# Patient Record
Sex: Male | Born: 1995 | Race: Black or African American | Hispanic: No | Marital: Single | Smoking: Never smoker
Health system: Southern US, Community
[De-identification: ages and names within clinical notes are randomized; demographics above are authoritative.]

## PROBLEM LIST (undated history)

## (undated) DIAGNOSIS — J45909 Unspecified asthma, uncomplicated: Secondary | ICD-10-CM

---

## 2015-03-09 ENCOUNTER — Emergency Department (HOSPITAL_COMMUNITY)
Admission: EM | Admit: 2015-03-09 | Discharge: 2015-03-09 | Disposition: A | Discharge status: Home / Self Care | Payer: BLUE CROSS/BLUE SHIELD | Attending: Emergency Medicine | Admitting: Emergency Medicine

## 2015-03-09 ENCOUNTER — Encounter (HOSPITAL_COMMUNITY): Payer: Self-pay | Admitting: *Deleted

## 2015-03-09 DIAGNOSIS — Y998 Other external cause status: Secondary | ICD-10-CM | POA: Diagnosis not present

## 2015-03-09 DIAGNOSIS — J45909 Unspecified asthma, uncomplicated: Secondary | ICD-10-CM | POA: Diagnosis not present

## 2015-03-09 DIAGNOSIS — Y92410 Unspecified street and highway as the place of occurrence of the external cause: Secondary | ICD-10-CM | POA: Diagnosis not present

## 2015-03-09 DIAGNOSIS — S299XXA Unspecified injury of thorax, initial encounter: Secondary | ICD-10-CM | POA: Insufficient documentation

## 2015-03-09 DIAGNOSIS — M546 Pain in thoracic spine: Secondary | ICD-10-CM

## 2015-03-09 DIAGNOSIS — Y9389 Activity, other specified: Secondary | ICD-10-CM | POA: Diagnosis not present

## 2015-03-09 HISTORY — DX: Unspecified asthma, uncomplicated: J45.909

## 2015-03-09 MED ORDER — METHOCARBAMOL 500 MG PO TABS
1000.0000 mg | ORAL_TABLET | Freq: Once | ORAL | Status: DC
  Filled 2015-03-09: qty 2

## 2015-03-09 MED ORDER — IBUPROFEN 800 MG PO TABS: 800.0000 mg | ORAL_TABLET | Freq: Three times a day (TID) | ORAL | Status: DC

## 2015-03-09 MED ORDER — METHOCARBAMOL 500 MG PO TABS: 500.0000 mg | ORAL_TABLET | Freq: Two times a day (BID) | ORAL | Status: DC

## 2015-03-09 MED ORDER — IBUPROFEN 400 MG PO TABS
800.0000 mg | ORAL_TABLET | Freq: Once | ORAL | Status: AC
  Administered 2015-03-09: 800 mg via ORAL
  Filled 2015-03-09: qty 2
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
## 2016-01-27 ENCOUNTER — Encounter (HOSPITAL_COMMUNITY): Payer: Self-pay | Admitting: Family Medicine

## 2016-01-27 ENCOUNTER — Ambulatory Visit (HOSPITAL_COMMUNITY)
Admission: EM | Admit: 2016-01-27 | Discharge: 2016-01-27 | Disposition: A | Discharge status: Home / Self Care | Payer: Medicaid Other | Attending: Family Medicine | Admitting: Family Medicine

## 2016-01-27 DIAGNOSIS — J0100 Acute maxillary sinusitis, unspecified: Secondary | ICD-10-CM

## 2016-01-27 DIAGNOSIS — J302 Other seasonal allergic rhinitis: Secondary | ICD-10-CM | POA: Diagnosis not present

## 2016-01-27 MED ORDER — FLUTICASONE PROPIONATE 50 MCG/ACT NA SUSP: 2.0000 | Freq: Every day | NASAL | 12 refills | Status: AC

## 2016-01-27 MED ORDER — AMOXICILLIN-POT CLAVULANATE 875-125 MG PO TABS: 1.0000 | ORAL_TABLET | Freq: Two times a day (BID) | ORAL | 0 refills | Status: DC

## 2016-01-27 NOTE — ED Triage Notes (Signed)
Pt here for nasal drainage with mucous that has been going on for over a month. sts all started after wisdom tooth surgery. sts he has been treated with abx x 1 and got a little better.

## 2016-01-27 NOTE — ED Provider Notes (Signed)
[facility]    CSN: [id number] Arrival date & time: 01/27/16  1614  First Provider Contact:  First MD Initiated Contact with Patient 01/27/16 1637        History   Chief Complaint Chief Complaint  Patient presents with  . Facial Pain  . Nasal Congestion    HPI Larry Shea is a 20 y.o. male.   This is 20 year old gentleman with 1 month of persistent sinus congestion and postnasal drainage. He works as a Scientist, forensiccertified medical assistant.  Patient had some dental work done which was complicated by an impacted wisdom tooth. He was given an antibiotic, with some improvement.  In the past patient has had chronic sinus problems that were alleviated by nasal sprays. He hasn't had the nasal spray in over a year.  Patient denies fever, cough, shortness of breath, or epistaxis      Past Medical History:  Diagnosis Date  . Asthma     There are no active problems to display for this patient.   History reviewed. No pertinent surgical history.     Home Medications    Prior to Admission medications   Medication Sig Start Date End Date Taking? Authorizing Provider  amoxicillin-clavulanate (AUGMENTIN) 875-125 MG tablet Take 1 tablet by mouth 2 (two) times daily. Take with food 01/27/16   Elvina SidleKurt Tavin Vernet, MD  fluticasone [facility](FLONASE) 50 MCG/ACT nasal spray Place 2 sprays into both nostrils daily. 01/27/16   Elvina SidleKurt Ogle Hoeffner, MD    Family History History reviewed. No pertinent family history.  Social History Social History  Substance Use Topics  . Smoking status: Never Smoker  . Smokeless tobacco: Never Used  . Alcohol use No     Allergies   Review of patient's allergies indicates no known allergies.   Review of Systems Review of Systems  Constitutional: Negative.   HENT: Positive for congestion, dental problem and postnasal drip. Negative for ear discharge, ear pain, facial swelling, mouth sores and sore throat.   Eyes: Negative.   Respiratory: Negative.     Cardiovascular: Negative.      Physical Exam Triage Vital Signs ED Triage Vitals [01/27/16 1632]  Enc Vitals Group     BP 115/76     Pulse Rate 85     Resp 18     Temp 98 F (36.7 C)     Temp src      SpO2 98 %     Weight      Height      Head Circumference      Peak Flow      Pain Score      Pain Loc      Pain Edu?      Excl. in GC?    No data found.   Updated Vital Signs BP 115/76   Pulse 85   Temp 98 F (36.7 C)   Resp 18   SpO2 98%   Visual Acuity Right Eye Distance:   Left Eye Distance:   Bilateral Distance:    Right Eye Near:   Left Eye Near:    Bilateral Near:     Physical Exam  Constitutional: He is oriented to person, place, and time. He appears well-developed and well-nourished.  HENT:  Head: Normocephalic and atraumatic.  Right Ear: External ear normal.  Left Ear: External ear normal.  Mouth/Throat: Oropharynx is clear and moist.  Bilateral nasal passage swelling with exudates  Eyes: Conjunctivae are normal. Pupils are equal, round, and reactive to light.  Neck:  Normal range of motion. Neck supple.  Cardiovascular: Normal rate, regular rhythm, normal heart sounds and intact distal pulses.   Pulmonary/Chest: Effort normal and breath sounds normal.  Musculoskeletal: Normal range of motion.  Neurological: He is alert and oriented to person, place, and time.  Nursing note and vitals reviewed.    UC Treatments / Results  Labs (all labs ordered are listed, but only abnormal results are displayed) Labs Reviewed - No data to display  EKG  EKG Interpretation None       Radiology No results found.  Procedures Procedures (including critical care time)  Medications Ordered in UC Medications - No data to display   Initial Impression / Assessment and Plan / UC Course  I have reviewed the triage vital signs and the nursing notes.  Pertinent labs & imaging results that were available during my care of the patient were reviewed by me  and considered in my medical decision making (see chart for details).  Clinical Course      Final Clinical Impressions(s) / UC Diagnoses   Final diagnoses:  Acute maxillary sinusitis, recurrence not specified  Seasonal allergies    New Prescriptions New Prescriptions   AMOXICILLIN-CLAVULANATE (AUGMENTIN) 875-125 MG TABLET    Take 1 tablet by mouth 2 (two) times daily. Take with food   FLUTICASONE (FLONASE) 50 MCG/ACT NASAL SPRAY    Place 2 sprays into both nostrils daily.     Elvina Sidle, MD 01/27/16 [phone]

## 2016-03-15 ENCOUNTER — Encounter (HOSPITAL_COMMUNITY): Payer: Self-pay | Admitting: Emergency Medicine

## 2016-03-15 ENCOUNTER — Ambulatory Visit (HOSPITAL_COMMUNITY)
Admission: EM | Admit: 2016-03-15 | Discharge: 2016-03-15 | Disposition: A | Discharge status: Home / Self Care | Payer: Medicaid Other | Attending: Internal Medicine | Admitting: Internal Medicine

## 2016-03-15 DIAGNOSIS — J019 Acute sinusitis, unspecified: Secondary | ICD-10-CM | POA: Diagnosis not present

## 2016-03-15 MED ORDER — PREDNISONE 50 MG PO TABS: 50.0000 mg | ORAL_TABLET | Freq: Every day | ORAL | 0 refills | Status: DC

## 2016-03-15 MED ORDER — CEFDINIR 300 MG PO CAPS: 300.0000 mg | ORAL_CAPSULE | Freq: Two times a day (BID) | ORAL | 0 refills | Status: DC

## 2016-03-15 NOTE — ED Provider Notes (Signed)
[facility]    CSN: [id number] Arrival date & time: 03/15/16  1633     History   Chief Complaint Chief Complaint  Patient presents with  . URI    HPI Larry Shea is a 20 y.o. male. Presents today with several weeks history significant sinus congestion and postnasal drainage, treated 2 with Augmentin. Using Flonase regularly. Onset following wisdom tooth extraction. No fever, no sore throat. Not coughing. Little bit of nausea, no vomiting, no diarrhea. Long history of otitis media as a kid. Has a history of asthma.   HPI  Past Medical History:  Diagnosis Date  . Asthma    History reviewed. No pertinent surgical history.     Home Medications    Prior to Admission medications   Medication Sig Start Date End Date Taking? Authorizing Provider  fluticasone (FLONASE) 50 MCG/ACT nasal spray Place 2 sprays into both nostrils daily. 01/27/16  Yes Elvina Sidle, MD  cefdinir (OMNICEF) 300 MG capsule Take 1 capsule (300 mg total) by mouth 2 (two) times daily. 03/15/16   Eustace Moore, MD  predniSONE (DELTASONE) 50 MG tablet Take 1 tablet (50 mg total) by mouth daily. 03/15/16   Eustace Moore, MD    Family History History reviewed. No pertinent family history.  Social History Social History  Substance Use Topics  . Smoking status: Never Smoker  . Smokeless tobacco: Never Used  . Alcohol use No     Allergies   Review of patient's allergies indicates no known allergies.   Review of Systems Review of Systems  All other systems reviewed and are negative.    Physical Exam Triage Vital Signs ED Triage Vitals [03/15/16 1658]  Enc Vitals Group     BP 130/70     Pulse Rate 100     Resp 18     Temp 98.8 F (37.1 C)     Temp Source Oral     SpO2 99 %     Weight      Height    Updated Vital Signs BP 130/70 (BP Location: Left Arm)   Pulse 100   Temp 98.8 F (37.1 C) (Oral)   Resp 18   SpO2 99%   Physical Exam  Constitutional: He is oriented  to person, place, and time. No distress.  Alert, nicely groomed Voice sounds very congested  HENT:  Head: Atraumatic.  Bilateral TMs are very dull, left is red Severe nasal congestion bilaterally Throat is quite red with copious postnasal drainage   Eyes:  Conjugate gaze, no eye redness/drainage  Neck: Neck supple.  Cardiovascular: Normal rate and regular rhythm.   Pulmonary/Chest: No respiratory distress.  Lungs clear, symmetric breath sounds  Abdominal: He exhibits no distension.  Musculoskeletal: Normal range of motion.  Neurological: He is alert and oriented to person, place, and time.  Skin: Skin is warm and dry.  No cyanosis  Nursing note and vitals reviewed.    UC Treatments / Results   Procedures Procedures (including critical care time)      None today   Final Clinical Impressions(s) / UC Diagnoses   Final diagnoses:  Acute sinusitis with symptoms > 10 days   Recheck for new fever >100.5, increasing phlegm production/nasal discharge, or if not starting to improve in a few days.  Congestion will take time to resolve, sometimes several weeks.  Might benefit from ENT evaluation if continuing to have frequent episodes of sinusitis.  New Prescriptions New Prescriptions   CEFDINIR (OMNICEF) 300 MG  CAPSULE    Take 1 capsule (300 mg total) by mouth 2 (two) times daily.   PREDNISONE (DELTASONE) 50 MG TABLET    Take 1 tablet (50 mg total) by mouth daily.     Eustace MooreLaura W Nguyet Mercer, MD 03/17/16 [phone]

## 2016-03-15 NOTE — ED Triage Notes (Signed)
Pt c/o cold sx onset 1 week associated w/HA, runny nose, nasal congestion  Denies fevers, chills  Taking Allegra and Flonase w/temp relief.   A&O x4... NAD

## 2016-03-15 NOTE — Discharge Instructions (Addendum)
Recheck for new fever >100.5, increasing phlegm production/nasal discharge, or if not starting to improve in a few days.  Congestion will take time to resolve, sometimes several weeks.  Might benefit from ENT evaluation if continuing to have frequent episodes of sinusitis.

## 2016-04-03 ENCOUNTER — Encounter (HOSPITAL_COMMUNITY): Payer: Self-pay | Admitting: Emergency Medicine

## 2016-04-03 ENCOUNTER — Ambulatory Visit (HOSPITAL_COMMUNITY)
Admission: EM | Admit: 2016-04-03 | Discharge: 2016-04-03 | Disposition: A | Discharge status: Home / Self Care | Payer: Medicaid Other | Attending: Family Medicine | Admitting: Family Medicine

## 2016-04-03 ENCOUNTER — Ambulatory Visit (INDEPENDENT_AMBULATORY_CARE_PROVIDER_SITE_OTHER): Payer: Medicaid Other

## 2016-04-03 DIAGNOSIS — J4521 Mild intermittent asthma with (acute) exacerbation: Secondary | ICD-10-CM

## 2016-04-03 MED ORDER — ALBUTEROL SULFATE (2.5 MG/3ML) 0.083% IN NEBU
5.0000 mg | INHALATION_SOLUTION | Freq: Once | RESPIRATORY_TRACT | Status: AC
  Administered 2016-04-03: 5 mg via RESPIRATORY_TRACT

## 2016-04-03 MED ORDER — IPRATROPIUM BROMIDE 0.02 % IN SOLN
0.5000 mg | Freq: Once | RESPIRATORY_TRACT | Status: AC
  Administered 2016-04-03: 0.5 mg via RESPIRATORY_TRACT

## 2016-04-03 MED ORDER — ALBUTEROL SULFATE (2.5 MG/3ML) 0.083% IN NEBU
INHALATION_SOLUTION | RESPIRATORY_TRACT | Status: AC
  Filled 2016-04-03: qty 6

## 2016-04-03 MED ORDER — METHYLPREDNISOLONE ACETATE 80 MG/ML IJ SUSP
INTRAMUSCULAR | Status: AC
  Filled 2016-04-03: qty 1

## 2016-04-03 MED ORDER — IPRATROPIUM BROMIDE 0.02 % IN SOLN
RESPIRATORY_TRACT | Status: AC
  Filled 2016-04-03: qty 2.5

## 2016-04-03 MED ORDER — ALBUTEROL SULFATE HFA 108 (90 BASE) MCG/ACT IN AERS: 2.0000 | INHALATION_SPRAY | Freq: Four times a day (QID) | RESPIRATORY_TRACT | 1 refills | Status: AC | PRN

## 2016-04-03 MED ORDER — METHYLPREDNISOLONE ACETATE 40 MG/ML IJ SUSP
80.0000 mg | Freq: Once | INTRAMUSCULAR | Status: AC
  Administered 2016-04-03: 80 mg via INTRAMUSCULAR

## 2016-04-03 NOTE — Discharge Instructions (Signed)
Use inhaler as needed, see your doctor if further problems.

## 2016-04-03 NOTE — ED Triage Notes (Signed)
[facility]. The patient reported that he has been having increased SOB that gets worse at night for 2 months. The patient stated that he believed that it could be exacerbation of his asthma.

## 2016-04-03 NOTE — ED Provider Notes (Signed)
[facility]    CSN: [id number] Arrival date & time: 04/03/16  1038     History   Chief Complaint Chief Complaint  Patient presents with  . Shortness of Breath    HPI Rise MuDrequan Valiente is a 20 y.o. male.   The history is provided by the patient.  Shortness of Breath  Severity:  Mild Onset quality:  Gradual Duration:  2 months Progression:  Worsening Chronicity:  New Context: pollens and weather changes   Relieved by:  None tried Worsened by:  Activity and movement Associated symptoms: sputum production and wheezing   Associated symptoms: no fever     Past Medical History:  Diagnosis Date  . Asthma     There are no active problems to display for this patient.   History reviewed. No pertinent surgical history.     Home Medications    Prior to Admission medications   Medication Sig Start Date End Date Taking? Authorizing Provider  cetirizine (ZYRTEC) 10 MG tablet Take 10 mg by mouth daily.   Yes Historical Provider, MD  cefdinir (OMNICEF) 300 MG capsule Take 1 capsule (300 mg total) by mouth 2 (two) times daily. 03/15/16   Eustace MooreLaura W Murray, MD  fluticasone (FLONASE) 50 MCG/ACT nasal spray Place 2 sprays into both nostrils daily. 01/27/16   Elvina SidleKurt Lauenstein, MD  predniSONE (DELTASONE) 50 MG tablet Take 1 tablet (50 mg total) by mouth daily. 03/15/16   Eustace MooreLaura W Murray, MD    Family History History reviewed. No pertinent family history.  Social History Social History  Substance Use Topics  . Smoking status: Never Smoker  . Smokeless tobacco: Never Used  . Alcohol use No     Allergies   Patient has no known allergies.   Review of Systems Review of Systems  Constitutional: Positive for activity change. Negative for fever.  HENT: Positive for congestion, postnasal drip and rhinorrhea.   Respiratory: Positive for sputum production, shortness of breath and wheezing.   Cardiovascular: Negative.   Gastrointestinal: Negative.      Physical  Exam Triage Vital Signs ED Triage Vitals  Enc Vitals Group     BP 04/03/16 1123 108/73     Pulse Rate 04/03/16 1123 85     Resp 04/03/16 1123 16     Temp 04/03/16 1123 97.9 F (36.6 C)     Temp Source 04/03/16 1123 Oral     SpO2 04/03/16 1123 98 %     Weight --      Height --      Head Circumference --      Peak Flow --      Pain Score 04/03/16 1131 0     Pain Loc --      Pain Edu? --      Excl. in GC? --    No data found.   Updated Vital Signs BP 108/73 (BP Location: Left Arm)   Pulse 85   Temp 97.9 F (36.6 C) (Oral)   Resp 16   SpO2 98%   Visual Acuity Right Eye Distance:   Left Eye Distance:   Bilateral Distance:    Right Eye Near:   Left Eye Near:    Bilateral Near:     Physical Exam  Constitutional: He appears well-developed and well-nourished. No distress.  HENT:  Right Ear: External ear normal.  Left Ear: External ear normal.  Mouth/Throat: Oropharynx is clear and moist.  Eyes: Pupils are equal, round, and reactive to light.  Neck: Normal range  of motion. Neck supple.  Cardiovascular: Normal rate, regular rhythm and normal heart sounds.   Pulmonary/Chest: Effort normal and breath sounds normal. He has no wheezes.  Lymphadenopathy:    He has no cervical adenopathy.  Neurological: He is alert.  Skin: Skin is warm and dry.  Nursing note and vitals reviewed.    UC Treatments / Results  Labs (all labs ordered are listed, but only abnormal results are displayed) Labs Reviewed - No data to display  EKG  EKG Interpretation None       Radiology No results found. X-rays reviewed and report per radiologist.  Procedures Procedures (including critical care time)  Medications Ordered in UC Medications - No data to display   Initial Impression / Assessment and Plan / UC Course  I have reviewed the triage vital signs and the nursing notes.  Pertinent labs & imaging results that were available during my care of the patient were reviewed by me  and considered in my medical decision making (see chart for details).  Clinical Course       Final Clinical Impressions(s) / UC Diagnoses   Final diagnoses:  None    New Prescriptions New Prescriptions   No medications on file     Linna HoffJames D Adewale Pucillo, MD 04/03/16 1223

## 2016-04-23 ENCOUNTER — Emergency Department (HOSPITAL_COMMUNITY): Payer: Medicaid Other

## 2016-04-23 ENCOUNTER — Emergency Department (HOSPITAL_COMMUNITY)
Admission: EM | Admit: 2016-04-23 | Discharge: 2016-04-23 | Disposition: A | Discharge status: Home / Self Care | Payer: Medicaid Other | Attending: Emergency Medicine | Admitting: Emergency Medicine

## 2016-04-23 ENCOUNTER — Encounter (HOSPITAL_COMMUNITY): Payer: Self-pay | Admitting: Emergency Medicine

## 2016-04-23 DIAGNOSIS — J45909 Unspecified asthma, uncomplicated: Secondary | ICD-10-CM | POA: Insufficient documentation

## 2016-04-23 DIAGNOSIS — R05 Cough: Secondary | ICD-10-CM | POA: Insufficient documentation

## 2016-04-23 DIAGNOSIS — R059 Cough, unspecified: Secondary | ICD-10-CM

## 2016-04-23 MED ORDER — PREDNISONE 20 MG PO TABS
60.0000 mg | ORAL_TABLET | Freq: Once | ORAL | Status: AC
  Administered 2016-04-23: 60 mg via ORAL
  Filled 2016-04-23: qty 3

## 2016-04-23 MED ORDER — PREDNISONE 50 MG PO TABS: 50.0000 mg | ORAL_TABLET | Freq: Every day | ORAL | 0 refills | Status: DC

## 2016-04-23 MED ORDER — BUDESONIDE 90 MCG/ACT IN AEPB: 1.0000 | INHALATION_SPRAY | Freq: Two times a day (BID) | RESPIRATORY_TRACT | 0 refills | Status: AC

## 2016-04-23 MED ORDER — IPRATROPIUM-ALBUTEROL 0.5-2.5 (3) MG/3ML IN SOLN
3.0000 mL | Freq: Once | RESPIRATORY_TRACT | Status: AC
  Administered 2016-04-23: 3 mL via RESPIRATORY_TRACT
  Filled 2016-04-23: qty 3

## 2016-04-23 NOTE — ED Notes (Signed)
Pt stable, ambulatory, states understanding of discharge instructions 

## 2016-04-23 NOTE — ED Triage Notes (Signed)
Pt. Stated, I was given an inhaler which I've been using, last used this morning.

## 2016-04-23 NOTE — ED Provider Notes (Signed)
[facility] Note   CSN: [id number] Arrival date & time: 04/23/16  1842     History   Chief Complaint Chief Complaint  Patient presents with  . Nasal Congestion  . Asthma    HPI Larry Shea is a 20 y.o. male.  He has had ongoing problems with cough for the last 3 months. This has been associated with a pressure feeling in his chest. He has seen his primary care provider and has been given 2 courses of antibiotics without any improvement. He was put on a course of prednisone which did give some temporary improvement. He went to urgent care about 2 weeks ago was given an inhaler which does give slight, temporary relief. He denies fever, chills, sweats. He denies dyspnea. Cough is productive of yellowish to greenish sputum. He is a nonsmoker.   The history is provided by the patient.  Asthma     Past Medical History:  Diagnosis Date  . Asthma     There are no active problems to display for this patient.   History reviewed. No pertinent surgical history.     Home Medications    Prior to Admission medications   Medication Sig Start Date End Date Taking? Authorizing Provider  albuterol (PROVENTIL HFA;VENTOLIN HFA) 108 (90 Base) MCG/ACT inhaler Inhale 2 puffs into the lungs every 6 (six) hours as needed for wheezing or shortness of breath. 04/03/16   Linna HoffJames D Kindl, MD  cefdinir (OMNICEF) 300 MG capsule Take 1 capsule (300 mg total) by mouth 2 (two) times daily. 03/15/16   Eustace MooreLaura W Murray, MD  cetirizine (ZYRTEC) 10 MG tablet Take 10 mg by mouth daily.    Historical Provider, MD  fluticasone (FLONASE) 50 MCG/ACT nasal spray Place 2 sprays into both nostrils daily. 01/27/16   Elvina SidleKurt Lauenstein, MD  predniSONE (DELTASONE) 50 MG tablet Take 1 tablet (50 mg total) by mouth daily. 03/15/16   Eustace MooreLaura W Murray, MD    Family History No family history on file.  Social History Social History  Substance Use Topics  . Smoking status: Never Smoker  . Smokeless tobacco:  Never Used  . Alcohol use No     Allergies   Patient has no known allergies.   Review of Systems Review of Systems  All other systems reviewed and are negative.    Physical Exam Updated Vital Signs BP 126/69   Pulse 80   Temp 98.5 F (36.9 C) (Oral)   Resp 16   Ht 5\' 10"  (1.778 m)   Wt 230 lb (104.3 kg)   SpO2 97%   BMI 33.00 kg/m   Physical Exam  Nursing note and vitals reviewed.  20 year old male, resting comfortably and in no acute distress. Vital signs are Normal. Oxygen saturation is 97%, which is normal. Head is normocephalic and atraumatic. PERRLA, EOMI. Oropharynx is clear. There is no sinus tenderness. Neck is nontender and supple without adenopathy or JVD. Back is nontender and there is no CVA tenderness. Lungs are clear without rales, wheezes, or rhonchi. Chest is nontender. Heart has regular rate and rhythm without murmur. Abdomen is soft, flat, nontender without masses or hepatosplenomegaly and peristalsis is normoactive. Extremities have no cyanosis or edema, full range of motion is present. Skin is warm and dry without rash. Neurologic: Mental status is normal, cranial nerves are intact, there are no motor or sensory deficits.  ED Treatments / Results   Radiology Dg Chest 2 View  Result Date: 04/23/2016 CLINICAL DATA:  Cough  EXAM: CHEST  2 VIEW COMPARISON:  04/03/2016 FINDINGS: The heart size and mediastinal contours are within normal limits. Both lungs are clear. The visualized skeletal structures are unremarkable. IMPRESSION: No active cardiopulmonary disease. Electronically Signed   By: Tollie Ethavid  Kwon M.D.   On: 04/23/2016 23:35    Procedures Procedures (including critical care time)  Medications Ordered in ED Medications  ipratropium-albuterol (DUONEB) 0.5-2.5 (3) MG/3ML nebulizer solution 3 mL (not administered)  predniSONE (DELTASONE) tablet 60 mg (not administered)     Initial Impression / Assessment and Plan / ED Course  I have  reviewed the triage vital signs and the nursing notes.  Pertinent imaging results that were available during my care of the patient were reviewed by me and considered in my medical decision making (see chart for details).  Clinical Course    Persistent productive cough. Exam is normal. Old records are reviewed, and he had been given a course of amoxicillin-clavulanic acid, and a course of cefdinir in addition to the steroid burst. At his last visit, he was given albuterol inhaler. He has had a normal chest x-ray 2 weeks ago. Will repeat chest x-ray, give dose of prednisone and give nebulizer treatment with albuterol and ipratropium. I anticipate the need to refer to pulmonology.  Chest x-ray is unremarkable. He feels better after nebulizer treatment. Will send home with a 5 day course of prednisone and also started on a steroid inhaler. Referral is given to pulmonology.  Final Clinical Impressions(s) / ED Diagnoses   Final diagnoses:  None    New Prescriptions New Prescriptions   No medications on file     Dione Boozeavid Huda Petrey, MD 04/23/16 2346

## 2016-04-23 NOTE — ED Triage Notes (Signed)
Pt. Stated, I've had congestion or virus since I had my wisdom teeth pulled in August. I've had 3 rounds of antibiotic, a round of Prednisone, and I just went to UC a week ago and given a steroid injection, Im still spitting up green mucous, and have that congestion in my chest.

## 2016-04-23 NOTE — Discharge Instructions (Signed)
Use your albuterol inhaler every four hours as needed for cough, or chest tightness.

## 2016-05-01 ENCOUNTER — Ambulatory Visit (HOSPITAL_COMMUNITY)
Admission: EM | Admit: 2016-05-01 | Discharge: 2016-05-01 | Disposition: A | Discharge status: Home / Self Care | Payer: Medicaid Other | Attending: Family Medicine | Admitting: Family Medicine

## 2016-05-01 ENCOUNTER — Encounter (HOSPITAL_COMMUNITY): Payer: Self-pay | Admitting: Family Medicine

## 2016-05-01 DIAGNOSIS — J45909 Unspecified asthma, uncomplicated: Secondary | ICD-10-CM | POA: Diagnosis not present

## 2016-05-01 DIAGNOSIS — J029 Acute pharyngitis, unspecified: Secondary | ICD-10-CM | POA: Insufficient documentation

## 2016-05-01 DIAGNOSIS — A545 Gonococcal pharyngitis: Secondary | ICD-10-CM

## 2016-05-01 DIAGNOSIS — Z202 Contact with and (suspected) exposure to infections with a predominantly sexual mode of transmission: Secondary | ICD-10-CM

## 2016-05-01 LAB — POCT RAPID STREP A: STREPTOCOCCUS, GROUP A SCREEN (DIRECT): NEGATIVE

## 2016-05-01 MED ORDER — AZITHROMYCIN 250 MG PO TABS
ORAL_TABLET | ORAL | Status: AC
  Filled 2016-05-01: qty 4

## 2016-05-01 MED ORDER — CEFTRIAXONE SODIUM 1 G IJ SOLR
INTRAMUSCULAR | Status: AC
  Filled 2016-05-01: qty 10

## 2016-05-01 MED ORDER — CEFTRIAXONE SODIUM 1 G IJ SOLR
1.0000 g | Freq: Once | INTRAMUSCULAR | Status: AC
  Administered 2016-05-01: 1 g via INTRAMUSCULAR

## 2016-05-01 MED ORDER — AZITHROMYCIN 250 MG PO TABS
1000.0000 mg | ORAL_TABLET | Freq: Once | ORAL | Status: AC
  Administered 2016-05-01: 1000 mg via ORAL

## 2016-05-01 NOTE — ED Provider Notes (Signed)
[facility]    CSN: [id number] Arrival date & time: 05/01/16  1036     History   Chief Complaint Chief Complaint  Patient presents with  . Sore Throat    HPI Larry Shea is a 20 y.o. male.   The history is provided by the patient.  Sore Throat  This is a new problem. The current episode started more than 1 week ago. The problem has been gradually worsening. Pertinent negatives include no chest pain, no abdominal pain, no headaches and no shortness of breath. Associated symptoms comments: Pt is homosexual and concern for std.    Past Medical History:  Diagnosis Date  . Asthma     There are no active problems to display for this patient.   History reviewed. No pertinent surgical history.     Home Medications    Prior to Admission medications   Medication Sig Start Date End Date Taking? Authorizing Provider  albuterol (PROVENTIL HFA;VENTOLIN HFA) 108 (90 Base) MCG/ACT inhaler Inhale 2 puffs into the lungs every 6 (six) hours as needed for wheezing or shortness of breath. 04/03/16   Linna HoffJames D Kindl, MD  Budesonide (PULMICORT FLEXHALER) 90 MCG/ACT inhaler Inhale 1 puff into the lungs 2 (two) times daily. 04/23/16   Dione Boozeavid Glick, MD  cetirizine (ZYRTEC) 10 MG tablet Take 10 mg by mouth daily.    Historical Provider, MD  fluticasone (FLONASE) 50 MCG/ACT nasal spray Place 2 sprays into both nostrils daily. 01/27/16   Elvina SidleKurt Lauenstein, MD    Family History History reviewed. No pertinent family history.  Social History Social History  Substance Use Topics  . Smoking status: Never Smoker  . Smokeless tobacco: Never Used  . Alcohol use No     Allergies   Patient has no known allergies.   Review of Systems Review of Systems  Constitutional: Negative for chills and fever.  HENT: Positive for sore throat. Negative for postnasal drip and rhinorrhea.   Respiratory: Negative for shortness of breath.   Cardiovascular: Negative for chest pain.  Gastrointestinal:  Negative for abdominal pain.  Genitourinary: Negative.  Negative for discharge, dysuria and penile pain.  Neurological: Negative for headaches.  All other systems reviewed and are negative.    Physical Exam Triage Vital Signs ED Triage Vitals [05/01/16 1115]  Enc Vitals Group     BP 126/74     Pulse Rate 83     Resp 16     Temp 98.1 F (36.7 C)     Temp Source Oral     SpO2 98 %     Weight      Height      Head Circumference      Peak Flow      Pain Score      Pain Loc      Pain Edu?      Excl. in GC?    No data found.   Updated Vital Signs BP 126/74   Pulse 83   Temp 98.1 F (36.7 C) (Oral)   Resp 16   SpO2 98%   Visual Acuity Right Eye Distance:   Left Eye Distance:   Bilateral Distance:    Right Eye Near:   Left Eye Near:    Bilateral Near:     Physical Exam  Constitutional: He is oriented to person, place, and time. He appears well-developed and well-nourished.  HENT:  Right Ear: External ear normal.  Left Ear: External ear normal.  Mouth/Throat: Oropharyngeal exudate present.  Eyes:  Pupils are equal, round, and reactive to light.  Neck: Normal range of motion. Neck supple.  Lymphadenopathy:    He has no cervical adenopathy.  Neurological: He is alert and oriented to person, place, and time.  Skin: Skin is warm and dry.  Nursing note and vitals reviewed.    UC Treatments / Results  Labs (all labs ordered are listed, but only abnormal results are displayed) Labs Reviewed - No data to display Strep neg. EKG  EKG Interpretation None       Radiology No results found.  Procedures Procedures (including critical care time)  Medications Ordered in UC Medications - No data to display   Initial Impression / Assessment and Plan / UC Course  I have reviewed the triage vital signs and the nursing notes.  Pertinent labs & imaging results that were available during my care of the patient were reviewed by me and considered in my medical  decision making (see chart for details).  Clinical Course      Final Clinical Impressions(s) / UC Diagnoses   Final diagnoses:  None    New Prescriptions New Prescriptions   No medications on file     Linna HoffJames D Kindl, MD 05/01/16 1147

## 2016-05-01 NOTE — ED Triage Notes (Signed)
Pt here for sore throat x 1 week. sts red swollen and exudates, sts also he wants STD testing. Denies symptoms related to STD.

## 2016-05-01 NOTE — Discharge Instructions (Signed)
We will call with positive test results and treat as indicated  °

## 2016-05-02 LAB — CYTOLOGY, (ORAL, ANAL, URETHRAL) ANCILLARY ONLY
CHLAMYDIA, DNA PROBE: NEGATIVE
NEISSERIA GONORRHEA: NEGATIVE

## 2016-05-02 LAB — URINE CYTOLOGY ANCILLARY ONLY
Chlamydia: NEGATIVE
NEISSERIA GONORRHEA: NEGATIVE
TRICH (WINDOWPATH): NEGATIVE

## 2016-05-03 LAB — CULTURE, GROUP A STREP (THRC)

## 2016-05-04 ENCOUNTER — Telehealth (HOSPITAL_COMMUNITY): Payer: Self-pay | Admitting: Emergency Medicine

## 2016-05-04 NOTE — Telephone Encounter (Signed)
Called pt and notified of recent lab results from visit 04/21/16 Pt ID'd properly... Reports feeling better Pt was neg for Strep and all STDs were normal  Adv pt if sx are not getting better to return or to f/u w/PCP Education on safe sex given Pt verb understanding.

## 2017-02-16 ENCOUNTER — Emergency Department (HOSPITAL_COMMUNITY)
Admission: EM | Admit: 2017-02-16 | Discharge: 2017-02-16 | Disposition: A | Discharge status: Home / Self Care | Payer: BLUE CROSS/BLUE SHIELD | Attending: Emergency Medicine | Admitting: Emergency Medicine

## 2017-02-16 ENCOUNTER — Encounter (HOSPITAL_COMMUNITY): Payer: Self-pay | Admitting: Emergency Medicine

## 2017-02-16 DIAGNOSIS — Z79899 Other long term (current) drug therapy: Secondary | ICD-10-CM | POA: Diagnosis not present

## 2017-02-16 DIAGNOSIS — R0789 Other chest pain: Secondary | ICD-10-CM | POA: Diagnosis present

## 2017-02-16 DIAGNOSIS — J4521 Mild intermittent asthma with (acute) exacerbation: Secondary | ICD-10-CM | POA: Diagnosis not present

## 2017-02-16 MED ORDER — RANITIDINE HCL 300 MG PO TABS: 300.0000 mg | ORAL_TABLET | Freq: Two times a day (BID) | ORAL | 0 refills | Status: DC

## 2017-02-16 MED ORDER — PREDNISONE 20 MG PO TABS: 40.0000 mg | ORAL_TABLET | Freq: Every day | ORAL | 0 refills | Status: DC

## 2017-02-16 MED ORDER — ALBUTEROL SULFATE (2.5 MG/3ML) 0.083% IN NEBU
5.0000 mg | INHALATION_SOLUTION | Freq: Once | RESPIRATORY_TRACT | Status: AC
  Administered 2017-02-16: 5 mg via RESPIRATORY_TRACT
  Filled 2017-02-16: qty 6

## 2017-02-16 NOTE — ED Triage Notes (Signed)
Pt states he has asthma and has been short of breath  Pt went to see his PCP today and was given Flovent  Pt states he has used it without relief

## 2017-02-16 NOTE — Discharge Instructions (Signed)
Take Prednisone for the next 5 days Take Zantac for burning chest pain as needed. If this helps with pain, this medicine is over the counter or you can get a prescription from your doctor Return for worsening symptoms

## 2017-02-16 NOTE — ED Provider Notes (Signed)
[facility] Provider Note   CSN: [id number] Arrival date & time: 02/16/17  0047     History   Chief Complaint Chief Complaint  Patient presents with  . Asthma   HPI Amit Leece is a 21 y.o. male who presents with SOB and chest pain. PMH significant for asthma. He states that he has been SOB for the past 4-5 days. He also has had some nasal congestion. He went to his doctor today who prescribed him Flovent. He has been using that and albuterol with no relief at home. He denies fever, URI symptoms, cough, palpitations, abdominal pain, N/V. He also has a substernal chest pain. It is burning and worse after eating. He has not tried anything for pain. It is intermittent. He had an EKG at his doctor's office today which was normal.  HPI  Past Medical History:  Diagnosis Date  . Asthma     There are no active problems to display for this patient.   History reviewed. No pertinent surgical history.     Home Medications    Prior to Admission medications   Medication Sig Start Date End Date Taking? Authorizing Provider  albuterol (PROVENTIL HFA;VENTOLIN HFA) 108 (90 Base) MCG/ACT inhaler Inhale 2 puffs into the lungs every 6 (six) hours as needed for wheezing or shortness of breath. 04/03/16   Linna Hoff, MD  Budesonide (PULMICORT FLEXHALER) 90 MCG/ACT inhaler Inhale 1 puff into the lungs 2 (two) times daily. 04/23/16   Dione Booze, MD  cetirizine (ZYRTEC) 10 MG tablet Take 10 mg by mouth daily.    [provider]  fluticasone (FLONASE) 50 MCG/ACT nasal spray Place 2 sprays into both nostrils daily. 01/27/16   Elvina Sidle, MD    Family History Family History  Problem Relation Age of Onset  . Hypertension Other     Social History Social History  Substance Use Topics  . Smoking status: Never Smoker  . Smokeless tobacco: Never Used  . Alcohol use No     Allergies   Patient has no known allergies.   Review of Systems Review of Systems    Constitutional: Negative for fever.  HENT: Positive for congestion. Negative for ear pain, rhinorrhea and sore throat.   Respiratory: Positive for shortness of breath. Negative for cough.   Cardiovascular: Positive for chest pain.  Gastrointestinal: Negative for abdominal pain, nausea and vomiting.     Physical Exam Updated Vital Signs BP 136/78 (BP Location: Left Arm)   Pulse 80   Temp 98.1 F (36.7 C) (Oral)   Resp 20   Ht  (1.803 m)   Wt 101.6 kg (224 lb)   SpO2 100%   BMI 31.24 kg/m   Physical Exam  Constitutional: He is oriented to person, place, and time. He appears well-developed and well-nourished. No distress.  HENT:  Head: Normocephalic and atraumatic.  Nose: Mucosal edema present.  Mouth/Throat: Uvula is midline, oropharynx is clear and moist and mucous membranes are normal.  Eyes: Pupils are equal, round, and reactive to light. Conjunctivae are normal. Right eye exhibits no discharge. Left eye exhibits no discharge. No scleral icterus.  Neck: Normal range of motion.  Cardiovascular: Normal rate and regular rhythm.  Exam reveals no gallop and no friction rub.   No murmur heard. Pulmonary/Chest: Effort normal and breath sounds normal. No respiratory distress. He has no wheezes. He has no rales. He exhibits no tenderness.  Abdominal: He exhibits no distension.  Neurological: He is alert and oriented to person,  place, and time.  Skin: Skin is warm and dry.  Psychiatric: He has a normal mood and affect. His behavior is normal.  Nursing note and vitals reviewed.    ED Treatments / Results  Labs (all labs ordered are listed, but only abnormal results are displayed) Labs Reviewed - No data to display  EKG  EKG Interpretation None       Radiology No results found.  Procedures Procedures (including critical care time)  Medications Ordered in ED Medications  albuterol (PROVENTIL) (2.5 MG/3ML) 0.083% nebulizer solution 5 mg (5 mg Nebulization Given  02/16/17 0157)     Initial Impression / Assessment and Plan / ED Course  I have reviewed the triage vital signs and the nursing notes.  Pertinent labs & imaging results that were available during my care of the patient were reviewed by me and considered in my medical decision making (see chart for details).  21 year old male presents with SOB and chest pain. Vitals are normal. Lungs are clear. He is still complaining of some SOB even after breathing tx here. Will add steroid burst. For chest pain he likely has GERD/gastritis. He has burning pain after eating. He had an EKG earlier today which was normal. Will add Zantac. Advised return for worsening symptoms  Final Clinical Impressions(s) / ED Diagnoses   Final diagnoses:  Exacerbation of intermittent asthma, unspecified asthma severity  Atypical chest pain    New Prescriptions New Prescriptions   No medications on file     Beryle Quant 02/16/17 2115    Loren Racer, MD 02/18/17 [phone]

## 2017-05-12 ENCOUNTER — Emergency Department (HOSPITAL_COMMUNITY)
Admission: EM | Admit: 2017-05-12 | Discharge: 2017-05-12 | Disposition: A | Discharge status: Home / Self Care | Payer: BLUE CROSS/BLUE SHIELD | Attending: Emergency Medicine | Admitting: Emergency Medicine

## 2017-05-12 ENCOUNTER — Encounter (HOSPITAL_COMMUNITY): Payer: Self-pay | Admitting: Emergency Medicine

## 2017-05-12 ENCOUNTER — Emergency Department (HOSPITAL_COMMUNITY): Payer: BLUE CROSS/BLUE SHIELD

## 2017-05-12 DIAGNOSIS — Z79899 Other long term (current) drug therapy: Secondary | ICD-10-CM | POA: Insufficient documentation

## 2017-05-12 DIAGNOSIS — J45909 Unspecified asthma, uncomplicated: Secondary | ICD-10-CM | POA: Insufficient documentation

## 2017-05-12 DIAGNOSIS — R0789 Other chest pain: Secondary | ICD-10-CM | POA: Insufficient documentation

## 2017-05-12 LAB — CBC
HEMATOCRIT: 44.3 % (ref 39.0–52.0)
HEMOGLOBIN: 15.6 g/dL (ref 13.0–17.0)
MCH: 29 pg (ref 26.0–34.0)
MCHC: 35.2 g/dL (ref 30.0–36.0)
MCV: 82.3 fL (ref 78.0–100.0)
Platelets: 189 10*3/uL (ref 150–400)
RBC: 5.38 MIL/uL (ref 4.22–5.81)
RDW: 13.3 % (ref 11.5–15.5)
WBC: 4.4 10*3/uL (ref 4.0–10.5)

## 2017-05-12 LAB — BASIC METABOLIC PANEL
ANION GAP: 5 (ref 5–15)
BUN: 9 mg/dL (ref 6–20)
CO2: 29 mmol/L (ref 22–32)
Calcium: 9 mg/dL (ref 8.9–10.3)
Chloride: 107 mmol/L (ref 101–111)
Creatinine, Ser: 0.75 mg/dL (ref 0.61–1.24)
GFR calc non Af Amer: >60 mL/min (ref >60)
GLUCOSE: 93 mg/dL (ref 65–99)
POTASSIUM: 4.1 mmol/L (ref 3.5–5.1)
Sodium: 141 mmol/L (ref 135–145)

## 2017-05-12 LAB — I-STAT TROPONIN, ED
TROPONIN I, POC: 0 ng/mL (ref 0.00–0.08)
Troponin i, poc: 0 ng/mL (ref 0.00–0.08)

## 2017-05-12 MED ORDER — IBUPROFEN 800 MG PO TABS
800.0000 mg | ORAL_TABLET | Freq: Once | ORAL | Status: AC
  Administered 2017-05-12: 800 mg via ORAL
  Filled 2017-05-12: qty 1

## 2017-05-12 NOTE — ED Triage Notes (Signed)
Patient c/o intermittent sharp right sided chest pain radiating to right arm since Wednesday. Denies any other complaints. Speaking in full sentences without difficulty.

## 2017-05-12 NOTE — Discharge Instructions (Signed)
Alternate 600 mg of ibuprofen and [phone] mg of Tylenol every 3 hours as needed for pain for the next 3-4 days. Do not exceed 4000 mg of Tylenol daily.  Apply ice or heat for comfort.  Do some gentle stretching of the upper arms.  Follow-up with a primary care physician for reevaluation of symptoms if they persist.  Return to the ED immediately for any concerning signs or symptoms develop.

## 2017-05-12 NOTE — ED Provider Notes (Signed)
[facility]-EMERGENCY DEPT Provider Note   CSN: [id number] Arrival date & time: 05/12/17  1649     History   Chief Complaint Chief Complaint  Patient presents with  . Chest Pain    HPI Larry Shea is a 21 y.o. male with history of asthma presents today with a chief complaint acute onset, intermittent right-sided chest pain for 5 days.  He states that pain was intermittent and would come on randomly and would last for approximately 1-3 hours at a time.  States that most recently pain began 4 hours ago and was constant.  Pain is sharp and stabbing in nature.  No aggravating or relieving factors noted.  He has not tried anything for his symptoms.  He notes pain will radiate to his right upper arm as well. 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  He denies any known trauma or falls.  Pain does not worsen with deep breaths.  He did travel to [place] on Friday and returned yesterday.  He states he took frequent breaks while driving and his pain began before his drive to [place].  He denies any recent surgeries, hemoptysis, testosterone replacement therapy, or prior history of DVT or PE.  He has a family history of hypertension but no significant family history of MI. Denies alcohol use, recreational drug use, or excessive caffeine intake.  Denies shortness of breath, fevers, chills, diaphoresis, nausea, vomiting, abdominal pain, lower extremity edema, or palpitations. The history is provided by the patient.    Past Medical History:  Diagnosis Date  . Asthma     There are no active problems to display for this patient.   History reviewed. No pertinent surgical history.     Home Medications    Prior to Admission medications   Medication Sig Start Date End Date Taking? Authorizing Provider  albuterol (PROVENTIL HFA;VENTOLIN HFA) 108 (90 Base) MCG/ACT inhaler Inhale 2 puffs into the lungs every 6 (six) hours as needed for wheezing or  shortness of breath. 04/03/16   Linna HoffKindl, James D, MD  Budesonide (PULMICORT FLEXHALER) 90 MCG/ACT inhaler Inhale 1 puff into the lungs 2 (two) times daily. 04/23/16   Dione BoozeGlick, David, MD  cetirizine (ZYRTEC) 10 MG tablet Take 10 mg by mouth daily.    [provider]  fluticasone (FLONASE) 50 MCG/ACT nasal spray Place 2 sprays into both nostrils daily. 01/27/16   Elvina SidleLauenstein, Kurt, MD  predniSONE (DELTASONE) 20 MG tablet Take 2 tablets (40 mg total) by mouth daily. 02/16/17   Bethel BornGekas, Kelly Marie, PA-C  ranitidine (ZANTAC) 300 MG tablet Take 1 tablet (300 mg total) by mouth 2 (two) times daily before a meal. 02/16/17   Bethel BornGekas, Kelly Marie, PA-C    Family History Family History  Problem Relation Age of Onset  . Hypertension Other     Social History Social History   Tobacco Use  . Smoking status: Never Smoker  . Smokeless tobacco: Never Used  Substance Use Topics  . Alcohol use: No  . Drug use: No     Allergies   Patient has no known allergies.   Review of Systems Review of Systems  Constitutional: Negative for chills, diaphoresis, fatigue and fever.  Respiratory: Negative for cough, shortness of breath and wheezing.   Cardiovascular: Positive for chest pain. Negative for palpitations and leg swelling.  Gastrointestinal: Negative for abdominal pain, diarrhea, nausea and vomiting.  Musculoskeletal: Positive for arthralgias (R arm).  Neurological: Negative for  syncope, weakness, numbness and headaches.  All other systems reviewed and are negative.    Physical Exam Updated Vital Signs BP 121/77   Pulse (!) 58   Resp 18   SpO2 100%   Physical Exam  Constitutional: He appears well-developed and well-nourished.  Non-toxic appearance. He does not appear ill. No distress.  HENT:  Head: Normocephalic and atraumatic.  Eyes: Conjunctivae and EOM are normal. Pupils are equal, round, and reactive to light. Right eye exhibits no discharge. Left eye exhibits no discharge.  Neck: Normal  range of motion. Neck supple. No JVD present. No tracheal deviation present.  Cardiovascular: Normal rate, regular rhythm, intact distal pulses and normal pulses. Exam reveals no gallop, no distant heart sounds and no friction rub.  No murmur heard. Pulses:      Carotid pulses are 2+ on the right side, and 2+ on the left side.      Radial pulses are 2+ on the right side, and 2+ on the left side.       Dorsalis pedis pulses are 2+ on the right side, and 2+ on the left side.       Posterior tibial pulses are 2+ on the right side, and 2+ on the left side.  Pulmonary/Chest: Effort normal and breath sounds normal. No accessory muscle usage or stridor. No tachypnea. No respiratory distress. He has no decreased breath sounds. He has no wheezes.  Point tenderness of the right sided anterior chest wall at around ribs 4-5. no ecchymosis, deformity, crepitus, or paradoxical wall motion.  He also has general mild tenderness to palpation of the pectoralis muscle.  Abdominal: Soft. He exhibits no distension. There is no tenderness.  Musculoskeletal: Normal range of motion. He exhibits no edema.       Right lower leg: He exhibits no tenderness and no edema.       Left lower leg: He exhibits no tenderness and no edema.  5/5 strength of BUE major muscle groups with no deformity, crepitus, swelling, or tenderness to palpation of the extremities  Lymphadenopathy:    He has no cervical adenopathy.  Neurological: He is alert.  Skin: Skin is warm and dry. Capillary refill takes less than 2 seconds. No erythema.  Psychiatric: He has a normal mood and affect. His behavior is normal.  Nursing note and vitals reviewed.    ED Treatments / Results  Labs (all labs ordered are listed, but only abnormal results are displayed) Labs Reviewed  BASIC METABOLIC PANEL  CBC  I-STAT TROPONIN, ED  I-STAT TROPONIN, ED    EKG  EKG Interpretation  Date/Time:  Sunday May 12 2017 17:24:44 EST Ventricular Rate:  65 PR  Interval:    QRS Duration: 88 QT Interval:  398 QTC Calculation: 414 R Axis:   79 Text Interpretation:  Sinus rhythm Baseline wander in lead(s) II III aVR aVF No previous ECGs available Confirmed by Richardean CanalYao, David H ([id number]) on 05/12/2017 9:12:09 PM       Radiology Dg Chest 2 View  Result Date: 05/12/2017 CLINICAL DATA:  Intermittent right upper quadrant chest pain. Right arm weakness. EXAM: CHEST  2 VIEW COMPARISON:  04/23/2016 FINDINGS: The heart size and mediastinal contours are within normal limits. Both lungs are clear. The visualized skeletal structures are unremarkable. IMPRESSION: No active cardiopulmonary disease. Electronically Signed   By: Richarda OverlieAdam  Henn M.D.   On: 05/12/2017 17:52    Procedures Procedures (including critical care time)  Medications Ordered in ED Medications  ibuprofen (ADVIL,MOTRIN) tablet 800  mg (800 mg Oral Given 05/12/17 1928)     Initial Impression / Assessment and Plan / ED Course  I have reviewed the triage vital signs and the nursing notes.  Pertinent labs & imaging results that were available during my care of the patient were reviewed by me and considered in my medical decision making (see chart for details).     Patient with intermittent right-sided chest wall pain for the past 5 days.  Pain is reproducible on palpation.  Afebrile, vital signs are stable.  Patient is nontoxic in appearance.  Serial troponins are negative, EKG shows no ST segment abnormality or arrhythmia.  He is incredibly low risk for cardiac etiology of his symptoms.  Chest x-ray shows no evidence of acute cardiopulmonary abnormality.  No evidence of pericarditis, myocarditis, pneumonia, bronchitis, or pleural effusion.  He is PERC negative and I doubt PE.  On reevaluation, patient states he is feeling much better after administration of ibuprofen.  No further emergent workup required at this time.  He is stable for discharge home with follow-up with primary care physician.  Discussed  utility of ibuprofen, Tylenol, ice, heat, and gentle stretching.  Discussed indications for return to the ED. Pt verbalized understanding of and agreement with plan and is safe for discharge home at this time.  No complaints prior to discharge.  Final Clinical Impressions(s) / ED Diagnoses   Final diagnoses:  Atypical chest pain    ED Discharge Orders    None       Bennye Alm 05/12/17 2151    Charlynne Pander, MD 05/12/17 [phone]

## 2017-10-24 ENCOUNTER — Other Ambulatory Visit: Payer: Self-pay

## 2017-10-24 ENCOUNTER — Emergency Department (HOSPITAL_BASED_OUTPATIENT_CLINIC_OR_DEPARTMENT_OTHER)
Admission: EM | Admit: 2017-10-24 | Discharge: 2017-10-24 | Disposition: A | Discharge status: Home / Self Care | Payer: BLUE CROSS/BLUE SHIELD | Attending: Emergency Medicine | Admitting: Emergency Medicine

## 2017-10-24 ENCOUNTER — Emergency Department (HOSPITAL_BASED_OUTPATIENT_CLINIC_OR_DEPARTMENT_OTHER): Payer: BLUE CROSS/BLUE SHIELD

## 2017-10-24 ENCOUNTER — Encounter (HOSPITAL_BASED_OUTPATIENT_CLINIC_OR_DEPARTMENT_OTHER): Payer: Self-pay

## 2017-10-24 DIAGNOSIS — R079 Chest pain, unspecified: Secondary | ICD-10-CM

## 2017-10-24 DIAGNOSIS — J45909 Unspecified asthma, uncomplicated: Secondary | ICD-10-CM | POA: Diagnosis not present

## 2017-10-24 DIAGNOSIS — Z79899 Other long term (current) drug therapy: Secondary | ICD-10-CM | POA: Diagnosis not present

## 2017-10-24 DIAGNOSIS — R0789 Other chest pain: Secondary | ICD-10-CM | POA: Diagnosis present

## 2017-10-24 DIAGNOSIS — F419 Anxiety disorder, unspecified: Secondary | ICD-10-CM | POA: Insufficient documentation

## 2017-10-24 LAB — D-DIMER, QUANTITATIVE: D-Dimer, Quant: <0.27 ug/mL-FEU (ref 0.00–0.50)

## 2017-10-24 LAB — BASIC METABOLIC PANEL
Anion gap: 7 (ref 5–15)
BUN: 13 mg/dL (ref 6–20)
CHLORIDE: 105 mmol/L (ref 101–111)
CO2: 28 mmol/L (ref 22–32)
Calcium: 9 mg/dL (ref 8.9–10.3)
Creatinine, Ser: 0.82 mg/dL (ref 0.61–1.24)
GFR calc Af Amer: >60 mL/min (ref >60)
GFR calc non Af Amer: >60 mL/min (ref >60)
GLUCOSE: 95 mg/dL (ref 65–99)
Potassium: 3.8 mmol/L (ref 3.5–5.1)
Sodium: 140 mmol/L (ref 135–145)

## 2017-10-24 LAB — CBC
HCT: 42.1 % (ref 39.0–52.0)
Hemoglobin: 15.2 g/dL (ref 13.0–17.0)
MCH: 29.2 pg (ref 26.0–34.0)
MCHC: 36.1 g/dL — ABNORMAL HIGH (ref 30.0–36.0)
MCV: 80.8 fL (ref 78.0–100.0)
PLATELETS: 195 10*3/uL (ref 150–400)
RBC: 5.21 MIL/uL (ref 4.22–5.81)
RDW: 13 % (ref 11.5–15.5)
WBC: 3.4 10*3/uL — ABNORMAL LOW (ref 4.0–10.5)

## 2017-10-24 LAB — TROPONIN I: Troponin I: <0.03 ng/mL (ref <0.03)

## 2017-10-24 MED ORDER — KETOROLAC TROMETHAMINE 15 MG/ML IJ SOLN
15.0000 mg | Freq: Once | INTRAMUSCULAR | Status: AC
  Administered 2017-10-24: 15 mg via INTRAVENOUS
  Filled 2017-10-24: qty 1

## 2017-10-24 NOTE — ED Triage Notes (Signed)
C/o CP x 1 week-was seen by PCP 2 days ago-had EKG-dx with costochondritis and rx meloxicam-pt states CP is worse-NAD-steady gait

## 2017-10-24 NOTE — ED Provider Notes (Signed)
[facility] EMERGENCY DEPARTMENT Provider Note   CSN: [id number] Arrival date & time: 10/24/17  1302     History   Chief Complaint Chief Complaint  Patient presents with  . Chest Pain    HPI Larry Shea is a 22 y.o. male with a hx of asthma who presents to the ED with complaints of chest pain for the past 1 week. Patient states that he has had fairly constant pain that is well localized to the left lower chest just lateral to the sternum. He states it "feels like something is there, like a knot or something." Pain is a 5/10 in severity. It is worse with a deep breath and if you press on it. No other specific alleviating/aggravating factors. He states he does feel anxious at times. He was seen by his new PCP for this 2 days ago- given prescriptions for robaxin and meloxicam- has taken these without relief, called today for continued pain, was instructed to come to the ED. He also tried gas-x without relief. Denies fever, chills, nausea, vomiting, diaphoresis, or dyspnea. Denies leg pain/swelling, hemoptysis, recent surgery/trauma, recent long travel, hormone use, personal hx of cancer, or hx of DVT/PE. No recent injuries or change in activity. He states he has had a hx of chest pain, without known etiology, most recently 2 months ago, but this feels somewhat different.  HPI  Past Medical History:  Diagnosis Date  . Asthma     There are no active problems to display for this patient.   History reviewed. No pertinent surgical history.      Home Medications    Prior to Admission medications   Medication Sig Start Date End Date Taking? Authorizing Provider  albuterol (PROVENTIL HFA;VENTOLIN HFA) 108 (90 Base) MCG/ACT inhaler Inhale 2 puffs into the lungs every 6 (six) hours as needed for wheezing or shortness of breath. 04/03/16   Linna HoffKindl, James D, MD  Budesonide (PULMICORT FLEXHALER) 90 MCG/ACT inhaler Inhale 1 puff into the lungs 2 (two) times daily. 04/23/16   Dione BoozeGlick, David,  MD  cetirizine (ZYRTEC) 10 MG tablet Take 10 mg by mouth daily.    [provider]  fluticasone (FLONASE) 50 MCG/ACT nasal spray Place 2 sprays into both nostrils daily. 01/27/16   Elvina SidleLauenstein, Kurt, MD  predniSONE (DELTASONE) 20 MG tablet Take 2 tablets (40 mg total) by mouth daily. 02/16/17   Bethel BornGekas, Kelly Marie, PA-C  ranitidine (ZANTAC) 300 MG tablet Take 1 tablet (300 mg total) by mouth 2 (two) times daily before a meal. 02/16/17   Bethel BornGekas, Kelly Marie, PA-C    Family History Family History  Problem Relation Age of Onset  . Hypertension Other     Social History Social History   Tobacco Use  . Smoking status: Never Smoker  . Smokeless tobacco: Never Used  Substance Use Topics  . Alcohol use: No  . Drug use: No     Allergies   Patient has no known allergies.   Review of Systems Review of Systems  Constitutional: Negative for chills and fever.  HENT: Positive for congestion.   Respiratory: Negative for cough, shortness of breath and wheezing.   Cardiovascular: Positive for chest pain. Negative for palpitations and leg swelling.  Gastrointestinal: Negative for abdominal pain, blood in stool, constipation, diarrhea, nausea and vomiting.  Musculoskeletal: Negative for back pain.  Skin: Negative for rash.  Neurological: Negative for syncope, weakness and numbness.  Psychiatric/Behavioral: The patient is nervous/anxious.   All other systems reviewed and are negative.  Physical Exam Updated Vital Signs BP 122/65 (BP Location: Left Arm)   Pulse 80   Temp 98.3 F (36.8 C) (Oral)   Resp 18   Ht 5\' 11"  (1.803 m)   Wt 96.6 kg (213 lb)   SpO2 98%   BMI 29.71 kg/m   Physical Exam  Constitutional: He appears well-developed and well-nourished. No distress.  HENT:  Head: Normocephalic and atraumatic.  Eyes: Conjunctivae are normal. Right eye exhibits no discharge. Left eye exhibits no discharge.  Cardiovascular: Normal rate and regular rhythm. Exam reveals no  friction rub.  No murmur heard. Pulses:      Radial pulses are 2+ on the right side, and 2+ on the left side.       Dorsalis pedis pulses are 2+ on the right side, and 2+ on the left side.  Pulmonary/Chest: Breath sounds normal. No respiratory distress. He has no wheezes. He has no rales. He exhibits no crepitus and no edema. Right breast exhibits no inverted nipple, no mass, no nipple discharge and no skin change. Left breast exhibits no inverted nipple, no mass, no nipple discharge and no skin change.    Abdominal: Soft. He exhibits no distension. There is no tenderness.  Musculoskeletal:       Right lower leg: He exhibits no tenderness and no edema.       Left lower leg: He exhibits no tenderness and no edema.  Neurological: He is alert.  Clear speech.   Skin: Skin is warm and dry. No rash noted.  Psychiatric: He has a normal mood and affect. His behavior is normal.  Nursing note and vitals reviewed.   ED Treatments / Results  Labs Results for orders placed or performed during the hospital encounter of 10/24/17  Basic metabolic panel  Result Value Ref Range   Sodium 140 135 - 145 mmol/L   Potassium 3.8 3.5 - 5.1 mmol/L   Chloride 105 101 - 111 mmol/L   CO2 28 22 - 32 mmol/L   Glucose, Bld 95 65 - 99 mg/dL   BUN 13 6 - 20 mg/dL   Creatinine, Ser 4.09 0.61 - 1.24 mg/dL   Calcium 9.0 8.9 - 81.1 mg/dL   GFR calc non Af Amer >60 >60 mL/min   GFR calc Af Amer >60 >60 mL/min   Anion gap 7 5 - 15  CBC  Result Value Ref Range   WBC 3.4 (L) 4.0 - 10.5 K/uL   RBC 5.21 4.22 - 5.81 MIL/uL   Hemoglobin 15.2 13.0 - 17.0 g/dL   HCT 91.4 78.2 - 95.6 %   MCV 80.8 78.0 - 100.0 fL   MCH 29.2 26.0 - 34.0 pg   MCHC 36.1 (H) 30.0 - 36.0 g/dL   RDW 21.3 08.6 - 57.8 %   Platelets 195 150 - 400 K/uL  Troponin I  Result Value Ref Range   Troponin I <0.03 <0.03 ng/mL  D-dimer, quantitative (not at [facility])  Result Value Ref Range   D-Dimer, Quant <0.27 0.00 - 0.50 ug/mL-FEU    EKG EKG  Interpretation  Date/Time:  Thursday October 24 2017 13:09:05 EDT Ventricular Rate:  72 PR Interval:  160 QRS Duration: 90 QT Interval:  362 QTC Calculation: 396 R Axis:   74 Text Interpretation:  Normal sinus rhythm Normal ECG Confirmed by Tilden Fossa [phone]) on 10/24/2017 1:11:33 PM   Radiology Dg Chest 2 View  Result Date: 10/24/2017 CLINICAL DATA:  22 year old male with chest pain for 1 week. EXAM: CHEST -  2 VIEW COMPARISON:  05/12/2017 and earlier. FINDINGS: Visualized tracheal air column is within normal limits. The heart size and mediastinal contours are within normal limits. Both lungs are clear. No osseous abnormality identified. Negative visible bowel gas pattern. IMPRESSION: Negative.  No acute cardiopulmonary abnormality. Electronically Signed   By: Odessa Fleming M.D.   On: 10/24/2017 13:27    Procedures Procedures (including critical care time)  Medications Ordered in ED Medications  ketorolac (TORADOL) 15 MG/ML injection 15 mg (15 mg Intravenous Given 10/24/17 1535)   Initial Impression / Assessment and Plan / ED Course  I have reviewed the triage vital signs and the nursing notes.  Pertinent labs & imaging results that were available during my care of the patient were reviewed by me and considered in my medical decision making (see chart for details).    Patient is a 22 year old male with a hx of asthma presenting with chest pain for the past 1 week. Patient nontoxic appearing, in no apparent distress, initial vitals WNL. Patient has well localized chest wall tenderness to palpation, no appreciable masses/"knots" on exam, no overlying erythema/fluctuance/induration to indicate infectious etiology. Will evaluate with CXR, EKG, and labs.   Patient with troponin WNL after 1 week of pain, EKG without obvious ischemia, in combination with presentation do not suspect ACS, given time frame do not feel repeat troponin is necessary- patient agrees. Patient low risk Wells- d-dimer  negative- doubt pulmonary embolism. Patient's pain is not a tearing sensation, symmetric pulses, no widening of mediastinum on imaging- doubt dissection.. CXR negative- no evidence of pneumonia, pleural effusion, or pneumothorax.  No appreciable palpable masses, bedside US applied to chest wall in area of patient's concern without obvious abnormalities. Abdomen is nontender to palpation. Labs grossly unremarkable, mild leukopenia at 3.4- will require PCP recheck. No significant electrolyte abnormalities. No anemia. Unclear definitive etiology to patient's pain, query MSK given reproducibility with palpation, also potential for anxiety component. Treated with toradol with improvement, on re-evaluation patient requesting discharge, feels ready to go home. Will discharge home with close PCP follow up. I discussed results, treatment plan, need for PCP follow-up, and return precautions with the patient. Provided opportunity for questions, patient confirmed understanding and is in agreement with plan.   Final Clinical Impressions(s) / ED Diagnoses   Final diagnoses:  Chest pain, unspecified type    ED Discharge Orders    None       Cherly Anderson, PA-C 10/24/17 1644    Tilden Fossa, MD 10/25/17 [phone]

## 2017-10-24 NOTE — Discharge Instructions (Addendum)
You were seen in the emergency department today for chest pain.  Your EKG, and the enzyme used to check your heart both reassuring.  The lab test we did to check for a blood clot in your lungs was normal, meaning that we do not think you have a blood clot in your lungs.  The ultrasound we did at the bedside did not show evidence of any type of fluid collection.  Your chest x-ray was normal.  We are unsure of the exact cause of your chest discomfort.  For this reason we would like you to follow-up closely with your primary care provider in the next 1 to 3 days.  Return to the ER at anytime for new or worsening symptoms including but not limited to worsening pain, trouble breathing, passing out, fever, or any other concerns.  Additionally your white blood cell count was low at 3.4, I do not suspect this is related to your chest pain, please have this rechecked by your primary care provider at your follow up visit.

## 2017-10-24 NOTE — ED Notes (Addendum)
Nurse went to clean room and pt left his dc paperwork on the bed, no prescriptions

## 2018-02-20 IMAGING — DX DG CHEST 2V
2 series · 2 of 2 positions shown · non-contrast
Comparison: 04/03/2016

CLINICAL DATA: Cough

EXAM:
CHEST  2 VIEW

[chest pa]
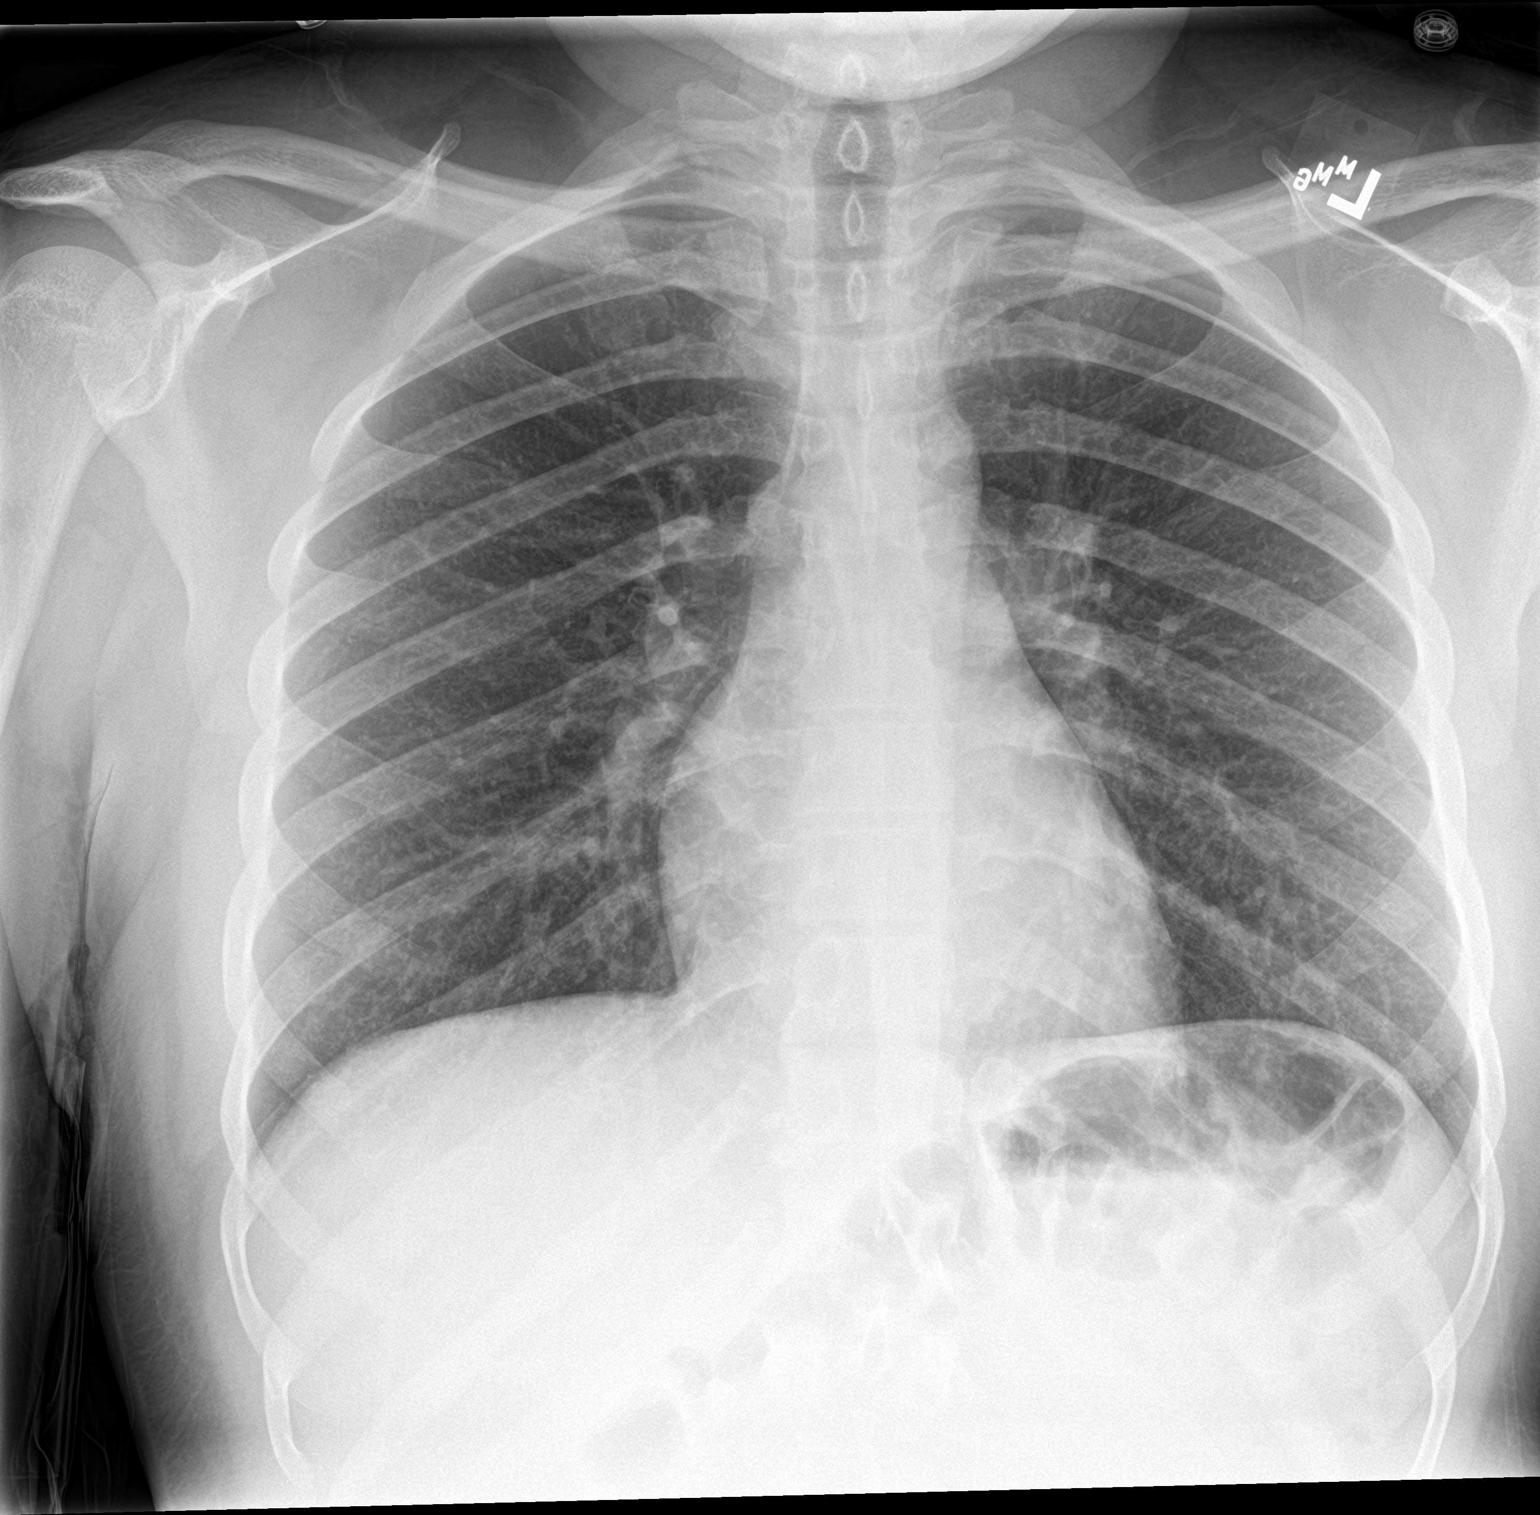

[chest lat]
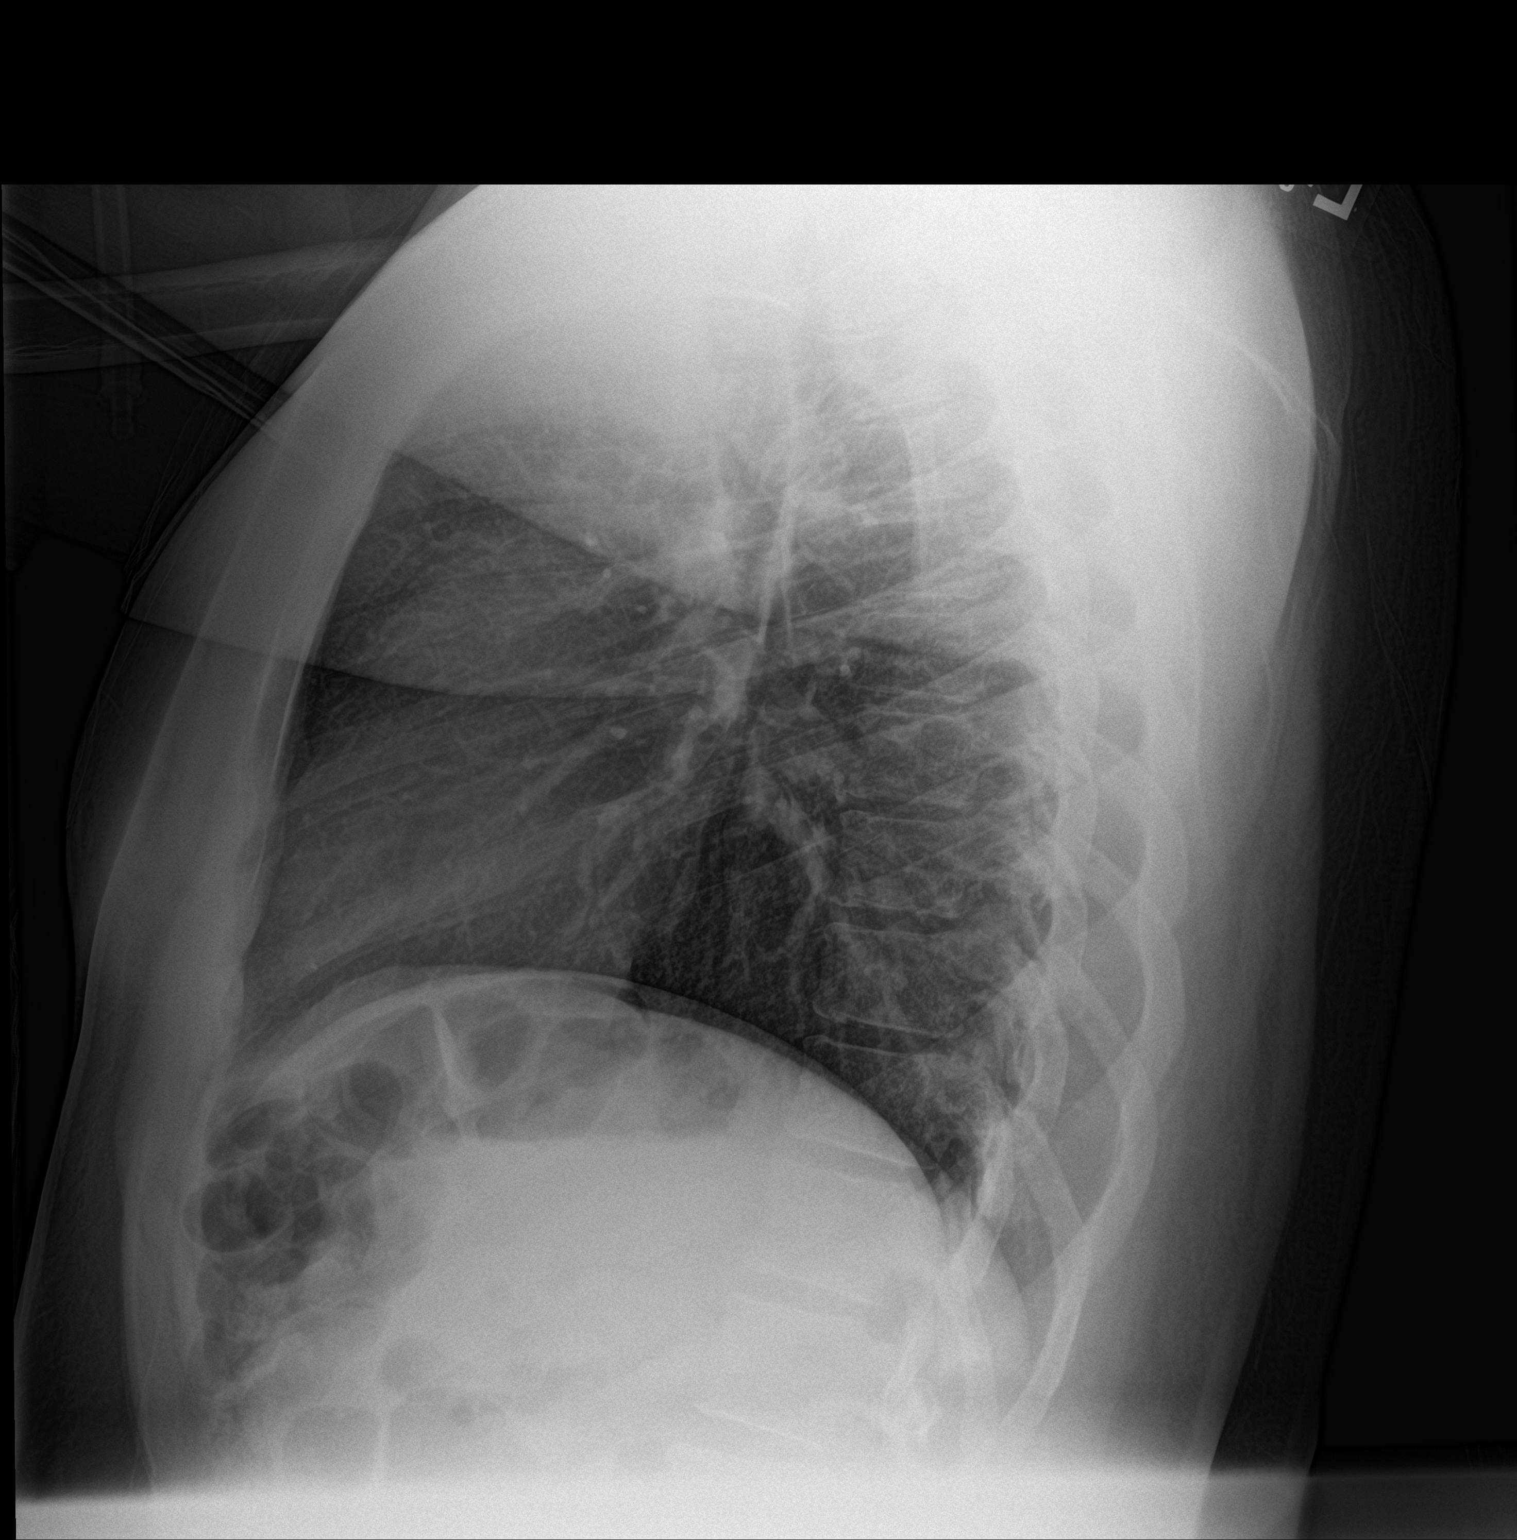

[2 of 2 positions shown; findings below may reference images not displayed]

FINDINGS: The heart size and mediastinal contours are within normal limits.
Both lungs are clear. The visualized skeletal structures are
unremarkable.
IMPRESSION: No active cardiopulmonary disease.

## 2018-10-05 ENCOUNTER — Encounter (HOSPITAL_BASED_OUTPATIENT_CLINIC_OR_DEPARTMENT_OTHER): Payer: Self-pay | Admitting: Emergency Medicine

## 2018-10-05 ENCOUNTER — Emergency Department (HOSPITAL_BASED_OUTPATIENT_CLINIC_OR_DEPARTMENT_OTHER): Payer: Self-pay

## 2018-10-05 ENCOUNTER — Emergency Department (HOSPITAL_BASED_OUTPATIENT_CLINIC_OR_DEPARTMENT_OTHER)
Admission: EM | Admit: 2018-10-05 | Discharge: 2018-10-05 | Disposition: A | Discharge status: Home / Self Care | Payer: Self-pay | Attending: Emergency Medicine | Admitting: Emergency Medicine

## 2018-10-05 ENCOUNTER — Other Ambulatory Visit: Payer: Self-pay

## 2018-10-05 DIAGNOSIS — R072 Precordial pain: Secondary | ICD-10-CM | POA: Insufficient documentation

## 2018-10-05 DIAGNOSIS — Z79899 Other long term (current) drug therapy: Secondary | ICD-10-CM | POA: Insufficient documentation

## 2018-10-05 DIAGNOSIS — J45909 Unspecified asthma, uncomplicated: Secondary | ICD-10-CM | POA: Insufficient documentation

## 2018-10-05 MED ORDER — OMEPRAZOLE 20 MG PO CPDR: 20.0000 mg | DELAYED_RELEASE_CAPSULE | Freq: Every day | ORAL | 0 refills | Status: AC

## 2018-10-05 MED ORDER — ALUM & MAG HYDROXIDE-SIMETH 200-200-20 MG/5ML PO SUSP
30.0000 mL | Freq: Once | ORAL | Status: AC
  Administered 2018-10-05: 30 mL via ORAL
  Filled 2018-10-05: qty 30

## 2018-10-05 NOTE — ED Notes (Signed)
Pt reports feeling intermittent chest pain over the last week, states sometimes right side and sometimes left side. He reports hx of asthma but states he is not currently on any medications

## 2018-10-05 NOTE — ED Provider Notes (Signed)
[facility] EMERGENCY DEPARTMENT Provider Note   CSN: [id number] Arrival date & time: 10/05/18  1507    History   Chief Complaint Chief Complaint  Patient presents with  . Chest Pain    HPI Larry Shea is a 23 y.o. male.     HPI Patient set healthy 23 year old male with no significant past medical history presents emergency department complaints of intermittent left and right-sided chest discomfort that comes and goes over the past week.  He states it is worse with lying flat and improves with sitting forward.  Is worse at night and improves if he props himself up on pillows.  He is tried Tums and Pepto-Bismol without improvement in symptoms.  He is never had gastroesophageal reflux disease.  He reports some discomfort in his arms.  Denies melena or hematochezia.  No abdominal pain.  Denies nausea vomiting or diarrhea.  No shortness of breath.  No cough.  No fever.  Asthma as a child only.  No adult onset asthma   Past Medical History:  Diagnosis Date  . Asthma     There are no active problems to display for this patient.   History reviewed. No pertinent surgical history.      Home Medications    Prior to Admission medications   Medication Sig Start Date End Date Taking? Authorizing Provider  albuterol (PROVENTIL HFA;VENTOLIN HFA) 108 (90 Base) MCG/ACT inhaler Inhale 2 puffs into the lungs every 6 (six) hours as needed for wheezing or shortness of breath. 04/03/16   Linna Hoff, MD  Budesonide (PULMICORT FLEXHALER) 90 MCG/ACT inhaler Inhale 1 puff into the lungs 2 (two) times daily. 04/23/16   Dione Booze, MD  cetirizine (ZYRTEC) 10 MG tablet Take 10 mg by mouth daily.    [provider]  fluticasone (FLONASE) 50 MCG/ACT nasal spray Place 2 sprays into both nostrils daily. 01/27/16   Elvina Sidle, MD  omeprazole (PRILOSEC) 20 MG capsule Take 1 capsule (20 mg total) by mouth daily. 10/05/18   Azalia Bilis, MD  predniSONE (DELTASONE) 20 MG tablet  Take 2 tablets (40 mg total) by mouth daily. 02/16/17   Bethel Born, PA-C  ranitidine (ZANTAC) 300 MG tablet Take 1 tablet (300 mg total) by mouth 2 (two) times daily before a meal. 02/16/17   Bethel Born, PA-C    Family History Family History  Problem Relation Age of Onset  . Hypertension Other     Social History Social History   Tobacco Use  . Smoking status: Never Smoker  . Smokeless tobacco: Never Used  Substance Use Topics  . Alcohol use: No  . Drug use: No     Allergies   Patient has no known allergies.   Review of Systems Review of Systems  All other systems reviewed and are negative.    Physical Exam Updated Vital Signs BP 118/72   Pulse 75   Temp 99.2 F (37.3 C) (Oral)   Resp 17   Ht 5\' 11"  (1.803 m)   Wt 86.2 kg   SpO2 100%   BMI 26.50 kg/m   Physical Exam Vitals signs and nursing note reviewed.  HENT:     Head: Normocephalic and atraumatic.  Neck:     Musculoskeletal: Normal range of motion.  Cardiovascular:     Rate and Rhythm: Normal rate and regular rhythm.  Pulmonary:     Effort: Pulmonary effort is normal. No respiratory distress.     Breath sounds: Normal breath sounds.  Abdominal:  General: There is no distension.     Tenderness: There is no abdominal tenderness.  Musculoskeletal: Normal range of motion.  Skin:    General: Skin is warm and dry.  Neurological:     Mental Status: He is alert and oriented to person, place, and time.      ED Treatments / Results  Labs (all labs ordered are listed, but only abnormal results are displayed) Labs Reviewed - No data to display  EKG EKG Interpretation  Date/Time:  Sunday Oct 05 2018 15:15:07 EDT Ventricular Rate:  86 PR Interval:    QRS Duration: 90 QT Interval:  352 QTC Calculation: 421 R Axis:   73 Text Interpretation:  Sinus rhythm No significant change was found \\ Confirmed by Yukio Bisping ([id number]) on 10/05/2018 4:11:52 PM   Radiology Dg Chest 2 View   Result Date: 10/05/2018 CLINICAL DATA:  Chest pain and shortness of breath. EXAM: CHEST - 2 VIEW COMPARISON:  October 24, 2017 FINDINGS: The heart size and mediastinal contours are within normal limits. Both lungs are clear. The visualized skeletal structures are unremarkable. IMPRESSION: No active cardiopulmonary disease. Electronically Signed   By: David  Williams III M.D   On: 10/05/2018 15:55    Procedures Procedures (including critical care time)  Medications Ordered in ED Medications  alum & mag hydroxide-simeth (MAALOX/MYLANTA) 200-200-20 MG/5ML suspension 30 mL (30 mLs Oral Given 10/05/18 1618)     Initial Impression / Assessment and Plan / ED Course  I have reviewed the triage vital signs and the nursing notes.  Pertinent labs & imaging results that were available during my care of the patient were reviewed by me and considered in my medical decision making (see chart for details).        Suspect gastroesophageal reflux disease.  Maalox given.  Home with Prilosec.  PCP follow-up.  EKG without ischemic changes.  Doubt PE.  Doubt ACS.  Doubt dissection.  Overall well-appearing.  Patient understands return to the ER for new or worsening symptoms  Final Clinical Impressions(s) / ED Diagnoses   Final diagnoses:  Precordial chest pain    ED Discharge Orders         Ordered    omeprazole (PRILOSEC) 20 MG capsule  Daily     05 /17/20 1655           Azalia Bilisampos, Tjay Velazquez, MD 10/05/18 1704

## 2018-10-05 NOTE — ED Notes (Signed)
Pt given Rx x 1 for prilosec

## 2018-10-05 NOTE — ED Triage Notes (Signed)
Generalized chest pain x 1 week. Described as pressure

## 2018-10-05 NOTE — Discharge Instructions (Addendum)
Take ibuprofen and Tylenol for the pain

## 2019-01-03 ENCOUNTER — Emergency Department (HOSPITAL_BASED_OUTPATIENT_CLINIC_OR_DEPARTMENT_OTHER): Payer: No Typology Code available for payment source

## 2019-01-03 ENCOUNTER — Other Ambulatory Visit: Payer: Self-pay

## 2019-01-03 ENCOUNTER — Emergency Department (HOSPITAL_BASED_OUTPATIENT_CLINIC_OR_DEPARTMENT_OTHER)
Admission: EM | Admit: 2019-01-03 | Discharge: 2019-01-03 | Disposition: A | Discharge status: Home / Self Care | Payer: No Typology Code available for payment source | Attending: Emergency Medicine | Admitting: Emergency Medicine

## 2019-01-03 ENCOUNTER — Encounter (HOSPITAL_BASED_OUTPATIENT_CLINIC_OR_DEPARTMENT_OTHER): Payer: Self-pay

## 2019-01-03 DIAGNOSIS — J45909 Unspecified asthma, uncomplicated: Secondary | ICD-10-CM | POA: Insufficient documentation

## 2019-01-03 DIAGNOSIS — Y999 Unspecified external cause status: Secondary | ICD-10-CM | POA: Insufficient documentation

## 2019-01-03 DIAGNOSIS — Y9389 Activity, other specified: Secondary | ICD-10-CM | POA: Insufficient documentation

## 2019-01-03 DIAGNOSIS — Y92410 Unspecified street and highway as the place of occurrence of the external cause: Secondary | ICD-10-CM | POA: Insufficient documentation

## 2019-01-03 DIAGNOSIS — S39012A Strain of muscle, fascia and tendon of lower back, initial encounter: Secondary | ICD-10-CM | POA: Insufficient documentation

## 2019-01-03 DIAGNOSIS — Z79899 Other long term (current) drug therapy: Secondary | ICD-10-CM | POA: Insufficient documentation

## 2019-01-03 DIAGNOSIS — S3992XA Unspecified injury of lower back, initial encounter: Secondary | ICD-10-CM | POA: Diagnosis present

## 2019-01-03 MED ORDER — NAPROXEN 250 MG PO TABS
500.0000 mg | ORAL_TABLET | Freq: Once | ORAL | Status: AC
  Administered 2019-01-03: 500 mg via ORAL
  Filled 2019-01-03: qty 2

## 2019-01-03 MED ORDER — NAPROXEN 375 MG PO TABS: ORAL_TABLET | ORAL | 0 refills | Status: AC

## 2019-01-03 NOTE — ED Notes (Signed)
Patient transported to X-ray 
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
## 2019-01-03 NOTE — ED Notes (Signed)
ED Provider at bedside.
